# Patient Record
Sex: Male | Born: 1970 | Race: White | Hispanic: No | Marital: Married | Smoking: Current some day smoker
Health system: Southern US, Community
[De-identification: ages and names within clinical notes are randomized; demographics above are authoritative.]

---

## 2020-06-18 ENCOUNTER — Emergency Department: Payer: PRIVATE HEALTH INSURANCE

## 2020-06-18 ENCOUNTER — Emergency Department
Admission: EM | Admit: 2020-06-18 | Discharge: 2020-06-18 | Disposition: A | Discharge status: Home / Self Care | Payer: PRIVATE HEALTH INSURANCE | Attending: Emergency Medicine | Admitting: Emergency Medicine

## 2020-06-18 ENCOUNTER — Encounter: Payer: Self-pay | Admitting: *Deleted

## 2020-06-18 ENCOUNTER — Other Ambulatory Visit: Payer: Self-pay

## 2020-06-18 DIAGNOSIS — X509XXA Other and unspecified overexertion or strenuous movements or postures, initial encounter: Secondary | ICD-10-CM | POA: Diagnosis not present

## 2020-06-18 DIAGNOSIS — S43015A Anterior dislocation of left humerus, initial encounter: Secondary | ICD-10-CM | POA: Diagnosis not present

## 2020-06-18 DIAGNOSIS — S4992XA Unspecified injury of left shoulder and upper arm, initial encounter: Secondary | ICD-10-CM | POA: Diagnosis present

## 2020-06-18 DIAGNOSIS — Y9339 Activity, other involving climbing, rappelling and jumping off: Secondary | ICD-10-CM | POA: Diagnosis not present

## 2020-06-18 DIAGNOSIS — F17200 Nicotine dependence, unspecified, uncomplicated: Secondary | ICD-10-CM | POA: Diagnosis not present

## 2020-06-18 DIAGNOSIS — S42112A Displaced fracture of body of scapula, left shoulder, initial encounter for closed fracture: Secondary | ICD-10-CM | POA: Insufficient documentation

## 2020-06-18 MED ORDER — HYDROCODONE-ACETAMINOPHEN 5-325 MG PO TABS
1.0000 | ORAL_TABLET | Freq: Once | ORAL | Status: AC
  Administered 2020-06-18: 1 via ORAL
  Filled 2020-06-18: qty 1

## 2020-06-18 MED ORDER — MORPHINE SULFATE (PF) 4 MG/ML IV SOLN
4.0000 mg | Freq: Once | INTRAVENOUS | Status: AC
  Administered 2020-06-18: 4 mg via INTRAVENOUS
  Filled 2020-06-18: qty 1

## 2020-06-18 MED ORDER — PROPOFOL 10 MG/ML IV BOLUS
0.5000 mg/kg | Freq: Once | INTRAVENOUS | Status: AC
  Administered 2020-06-18: 44.5 mg via INTRAVENOUS
  Filled 2020-06-18: qty 20

## 2020-06-18 MED ORDER — PROPOFOL 10 MG/ML IV BOLUS
INTRAVENOUS | Status: AC | PRN
  Administered 2020-06-18 (×2): 44.45 mg via INTRAVENOUS

## 2020-06-18 MED ORDER — ONDANSETRON HCL 4 MG/2ML IJ SOLN
4.0000 mg | Freq: Once | INTRAMUSCULAR | Status: AC
  Administered 2020-06-18: 4 mg via INTRAVENOUS
  Filled 2020-06-18: qty 2

## 2020-06-18 NOTE — ED Notes (Signed)
Pt sitting up in bed drinking gingerale.  Tolerating well.

## 2020-06-18 NOTE — ED Notes (Signed)
IV catheter removed intact without complication.  D/C instructions given.  Advised of follow up.  Pt instructed not to drive, pt to be transported home with stable ride.  All  Questions addressed, understanding verbalized.  Pt left ER ambulatory with steady gait.

## 2020-06-18 NOTE — ED Triage Notes (Signed)
Injured left shoulder 2 hours ago.  Patient states he has dislocated shoulder in the past.  Deformity noted.  Patient grunting.  Skin warm and dry.

## 2020-06-18 NOTE — ED Provider Notes (Signed)
[facility] Emergency Department Provider Note   ____________________________________________   Event Date/Time   First MD Initiated Contact with Patient 06/18/20 1554     (approximate)  I have reviewed the triage vital signs and the nursing notes.   HISTORY  Chief Complaint Shoulder Pain    HPI George Kirk is a 50 y.o. 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  Patient states that he has been unable to raise his left arm since he heard this pop and has had significant 10/10, nonradiating, aching pain that is worsened with any movement or palpation.  Patient has not tried any medications for this pain.  Patient currently denies any vision changes, tinnitus, difficulty speaking, facial droop, sore throat, chest pain, shortness of breath, abdominal pain, nausea/vomiting/diarrhea, dysuria, or numbness/paresthesias in any extremity         No past medical history on file.  There are no problems to display for this patient.    Prior to Admission medications   Not on File    Allergies Patient has no known allergies.  No family history on file.  Social History Social History   Tobacco Use  . Smoking status: Current Some Day Smoker  . Smokeless tobacco: Never Used  Substance Use Topics  . Alcohol use: Not Currently  . Drug use: Not Currently    Review of Systems Constitutional: No fever/chills Eyes: No visual changes. ENT: No sore throat. Cardiovascular: Denies chest pain. Respiratory: Denies shortness of breath. Gastrointestinal: No abdominal pain.  No nausea, no vomiting.  No diarrhea. Genitourinary: Negative for dysuria. Musculoskeletal: Positive for acute left shoulder pain Skin: Negative for rash. Neurological: Negative for headaches, weakness/numbness/paresthesias in any extremity Psychiatric: Negative  for suicidal ideation/homicidal ideation   ____________________________________________   PHYSICAL EXAM:  VITAL SIGNS: ED Triage Vitals  Enc Vitals Group     BP 06/18/20 1553 (!) 137/100     Pulse Rate 06/18/20 1553 87     Resp 06/18/20 1553 (!) 24     Temp 06/18/20 1553 97.7 F (36.5 C)     Temp Source 06/18/20 1553 Oral     SpO2 06/18/20 1553 98 %     Weight 06/18/20 1547 196 lb (88.9 kg)     Height 06/18/20 1547 5\' 6"  (1.676 m)     Head Circumference --      Peak Flow --      Pain Score 06/18/20 1547 9     Pain Loc --      Pain Edu? --      Excl. in GC? --    Constitutional: Alert and oriented. Well appearing and in no acute distress. Eyes: Conjunctivae are normal. PERRL. Head: Atraumatic. Nose: No congestion/rhinnorhea. Mouth/Throat: Mucous membranes are moist. Neck: No stridor Cardiovascular: Grossly normal heart sounds.  Good peripheral circulation. Respiratory: Normal respiratory effort.  No retractions. Gastrointestinal: Soft and nontender. No distention. Musculoskeletal: Obvious loss of shoulder contour at the left shoulder with significant tenderness to palpation and inability to range the shoulder secondary to pain Neurologic:  Normal speech and language. No gross focal neurologic deficits are appreciated. Skin:  Skin is warm and dry. No rash noted. Psychiatric: Mood and affect are normal. Speech and behavior are normal.  ____________________________________________   LABS (all labs ordered are listed, but only abnormal results are displayed)  Labs Reviewed - No data to  display ____________________________________________  RADIOLOGY  ED MD interpretation: Three-view x-ray of the left shoulder shows an anterior shoulder dislocation with a possible Hill-Sachs deformity lateral humeral head  Official radiology report(s): DG Shoulder Left  Result Date: 06/18/2020 CLINICAL DATA:  Left shoulder pain, felt a pop. Injury getting out of truck 2 hours ago  EXAM: LEFT SHOULDER - 2+ VIEW COMPARISON:  None. FINDINGS: Anterior dislocation of the humerus with respect to the glenoid. Hill-Sachs impaction injury is suspected to the lateral humeral head. No obvious bony Bankart. Acromioclavicular joint is congruent. IMPRESSION: Anterior shoulder dislocation. Hill-Sachs impaction injury is suspected to the lateral humeral head. Electronically Signed   By: Narda Rutherford M.D.   On: 06/18/2020 16:38    ____________________________________________   PROCEDURES  Procedure(s) performed (including Critical Care):  .Ortho Injury Treatment  Date/Time: 06/18/2020 5:30 PM Performed by: Merwyn Katos, MD Authorized by: Merwyn Katos, MD   Consent:    Consent obtained:  Verbal   Consent given by:  Patient   Risks discussed:  Irreducible dislocation and restricted joint movement   Alternatives discussed:  No treatment, immobilization and referralInjury location: shoulder Location details: left shoulder Injury type: dislocation Dislocation type: anterior Hill-Sachs deformity: yes Chronicity: recurrent Pre-procedure neurovascular assessment: neurovascularly intact Pre-procedure distal perfusion: normal Pre-procedure neurological function: normal Pre-procedure range of motion: reduced  Anesthesia: Local anesthesia used: no  Patient sedated: Yes. Refer to sedation procedure documentation for details of sedation. Manipulation performed: yes Reduction method: external rotation Reduction successful: yes X-ray confirmed reduction: yes Immobilization: sling Splint Applied by: ED Nurse and ED Provider Post-procedure neurovascular assessment: post-procedure neurovascularly intact Post-procedure distal perfusion: normal Post-procedure neurological function: normal Post-procedure range of motion: improved  .Sedation  Date/Time: 06/18/2020 5:32 PM Performed by: Merwyn Katos, MD Authorized by: Merwyn Katos, MD   Consent:    Consent obtained:   Verbal   Consent given by:  Patient   Risks discussed:  Allergic reaction, prolonged hypoxia resulting in organ damage and inadequate sedation   Alternatives discussed:  Analgesia without sedation, anxiolysis and regional anesthesia Universal protocol:    Immediately prior to procedure, a time out was called: yes   Indications:    Procedure performed:  Dislocation reduction   Procedure necessitating sedation performed by:  Physician performing sedation Pre-sedation assessment:    Time since last food or drink:  1230   NPO status caution: urgency dictates proceeding with non-ideal NPO status     ASA classification: class 1 - normal, healthy patient     Mouth opening:  3 or more finger widths   Thyromental distance:  3 finger widths   Mallampati score:  II - soft palate, uvula, fauces visible   Neck mobility: normal     Pre-sedation assessments completed and reviewed: airway patency, cardiovascular function, mental status, pain level, respiratory function and temperature     Pre-sedation assessments completed and reviewed: pre-procedure nausea and vomiting status not reviewed   Immediate pre-procedure details:    Reassessment: Patient reassessed immediately prior to procedure     Reviewed: vital signs     Verified: bag valve mask available, emergency equipment available, intubation equipment available, IV patency confirmed, oxygen available and suction available   Procedure details (see MAR for exact dosages):    Preoxygenation:  Nasal cannula   Sedation:  Propofol   Intended level of sedation: moderate (conscious sedation)   Analgesia:  Morphine   Intra-procedure monitoring:  Blood pressure monitoring, cardiac monitor, continuous capnometry, continuous pulse oximetry, frequent  LOC assessments and frequent vital sign checks   Intra-procedure events: none     Intra-procedure management:  Supplemental oxygen   Total Provider sedation time (minutes):  23 Post-procedure details:     Attendance: Constant attendance by certified staff until patient recovered     Recovery: Patient returned to pre-procedure baseline     Post-sedation assessments completed and reviewed: airway patency, cardiovascular function, mental status, pain level, respiratory function and temperature     Post-sedation assessments completed and reviewed: post-procedure nausea and vomiting status not reviewed     Patient is stable for discharge or admission: yes     Procedure completion:  Tolerated well, no immediate complications .1-3 Lead EKG Interpretation Performed by: Merwyn Katos, MD Authorized by: Merwyn Katos, MD     Interpretation: normal     ECG rate:  89   ECG rate assessment: normal     Rhythm: sinus rhythm     Ectopy: none     Conduction: normal       ____________________________________________   INITIAL IMPRESSION / ASSESSMENT AND PLAN / ED COURSE  As part of my medical decision making, I reviewed the following data within the electronic MEDICAL RECORD NUMBER Nursing notes reviewed and incorporated, Labs reviewed, EKG interpreted, Old chart reviewed, Radiograph reviewed and Notes from prior ED visits reviewed and incorporated     Workup: XR Shoulder Findings: Dislocation  Patient does not currently demonstrate complications of dislocation such as compartment syndrome, arterial injury or nerve injury. The dislocation has been satisfactorily reduced and immobilized, and the patient has been given appropriate analgesia. Rx: sling immobilization 1 week, with gentle ROM to follow Disposition: Discharge with strict return precautions and instructions to follow up with primary MD within 48 hours for further evaluation including referral to an orthopedist.       ____________________________________________   FINAL CLINICAL IMPRESSION(S) / ED DIAGNOSES  Final diagnoses:  Traumatic closed displaced fracture of left shoulder with anterior dislocation, initial encounter      ED Discharge Orders    None       Note:  This document was prepared using Dragon voice recognition software and may include unintentional dictation errors.   Merwyn Katos, MD 06/18/20 1736

## 2020-06-18 NOTE — Sedation Documentation (Signed)
Shoulder sling placed on patient at this time.

## 2020-06-18 NOTE — ED Triage Notes (Signed)
t to triage via wheelchair.  Pt states he was getting into the truck and felt left shoulder pop.  Pt yelling out in triage.  Pt alert.

## 2022-01-02 IMAGING — CR DG SHOULDER 2+V*L*
3 series · 3 of 3 positions shown · non-contrast
Comparison: None.

CLINICAL DATA: Left shoulder pain, felt a pop. Injury getting out
of truck 2 hours ago

EXAM:
LEFT SHOULDER - 2+ VIEW

[shoulder grashey (1 of 2)]
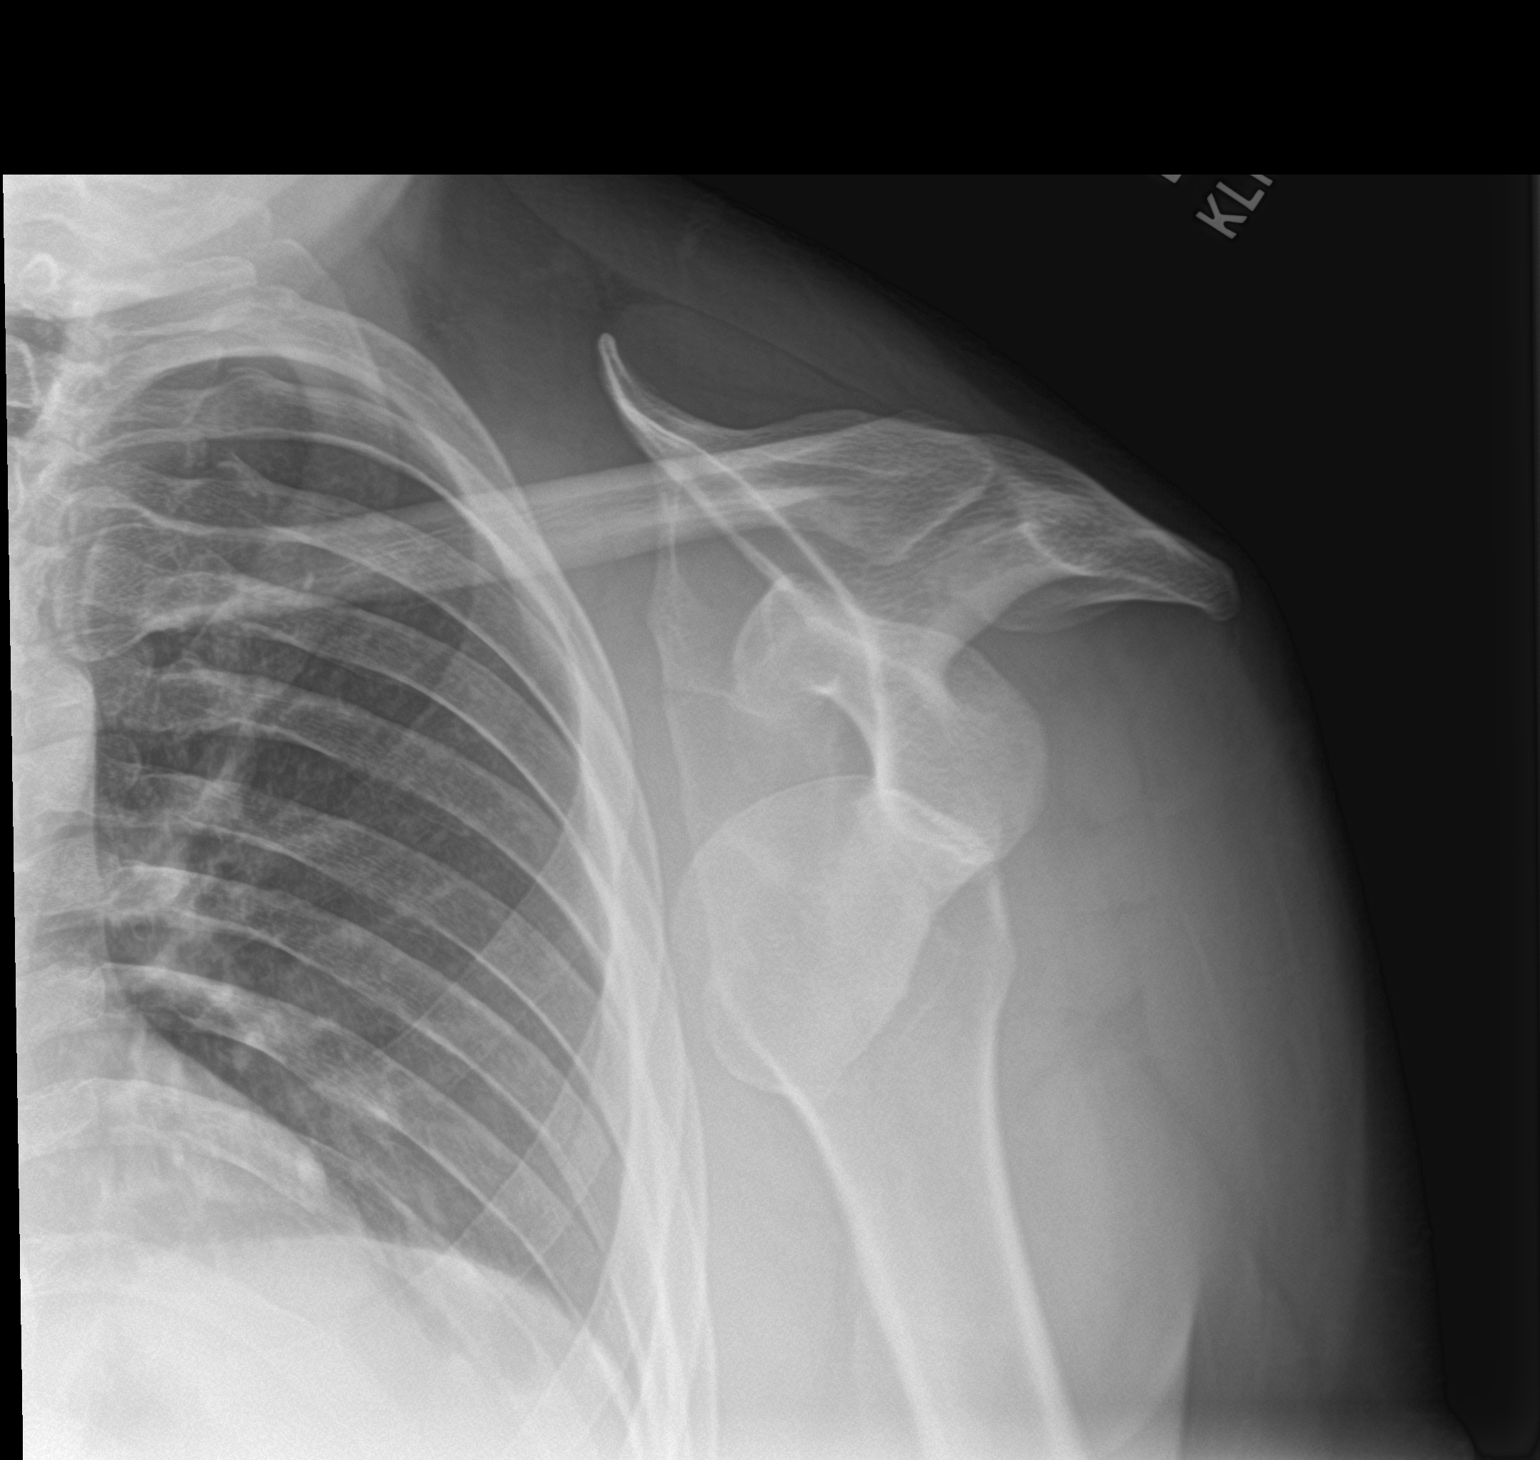

[shoulder y view]
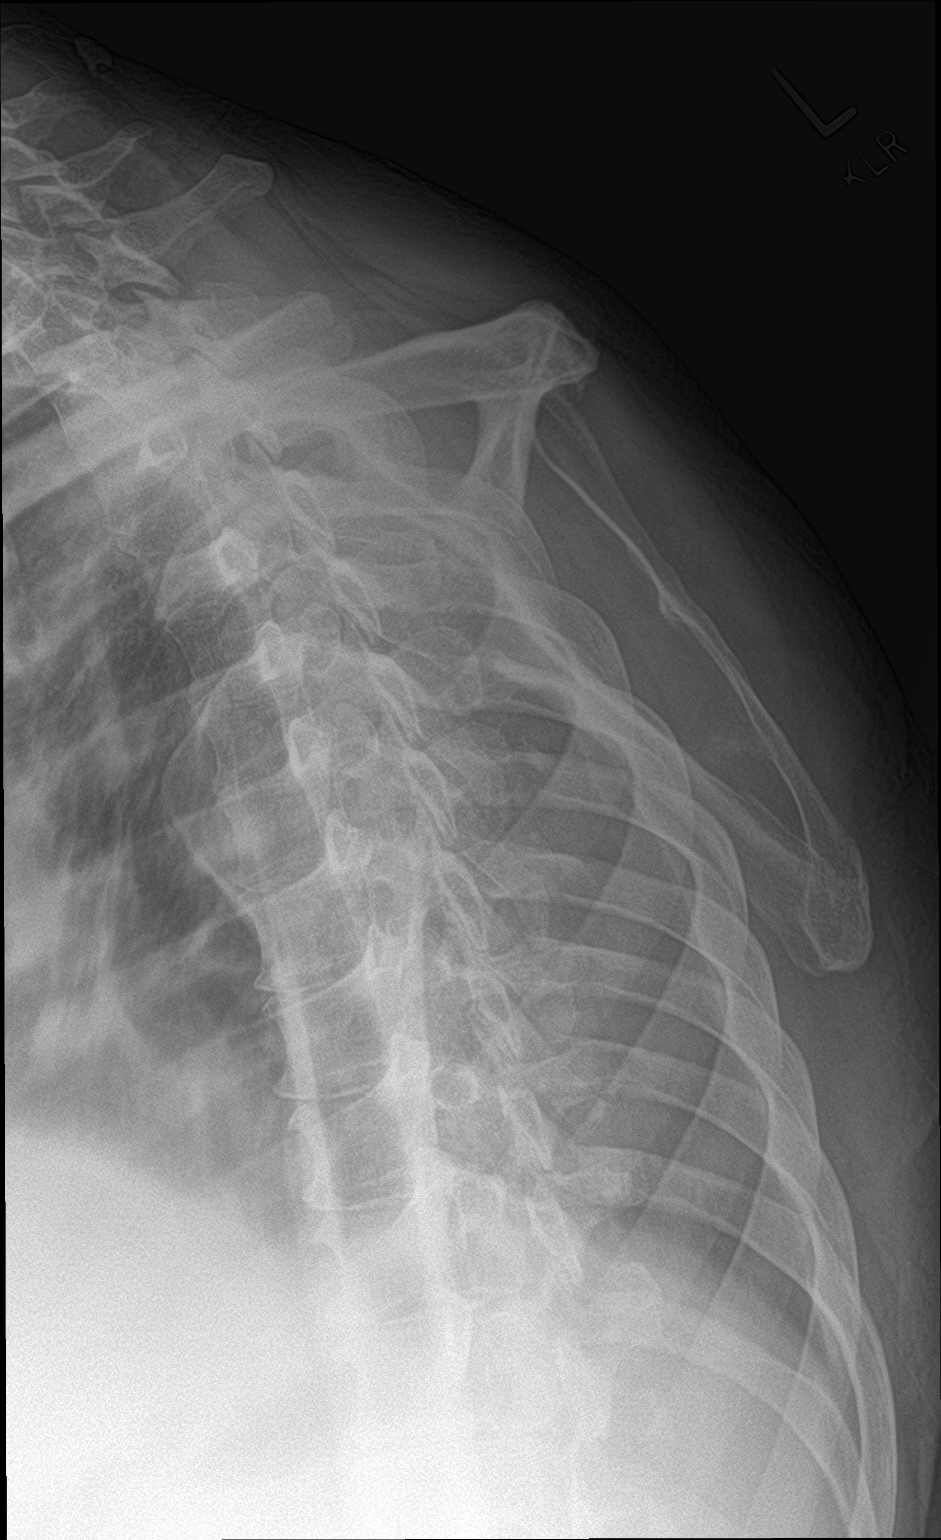

[shoulder grashey (2 of 2)]
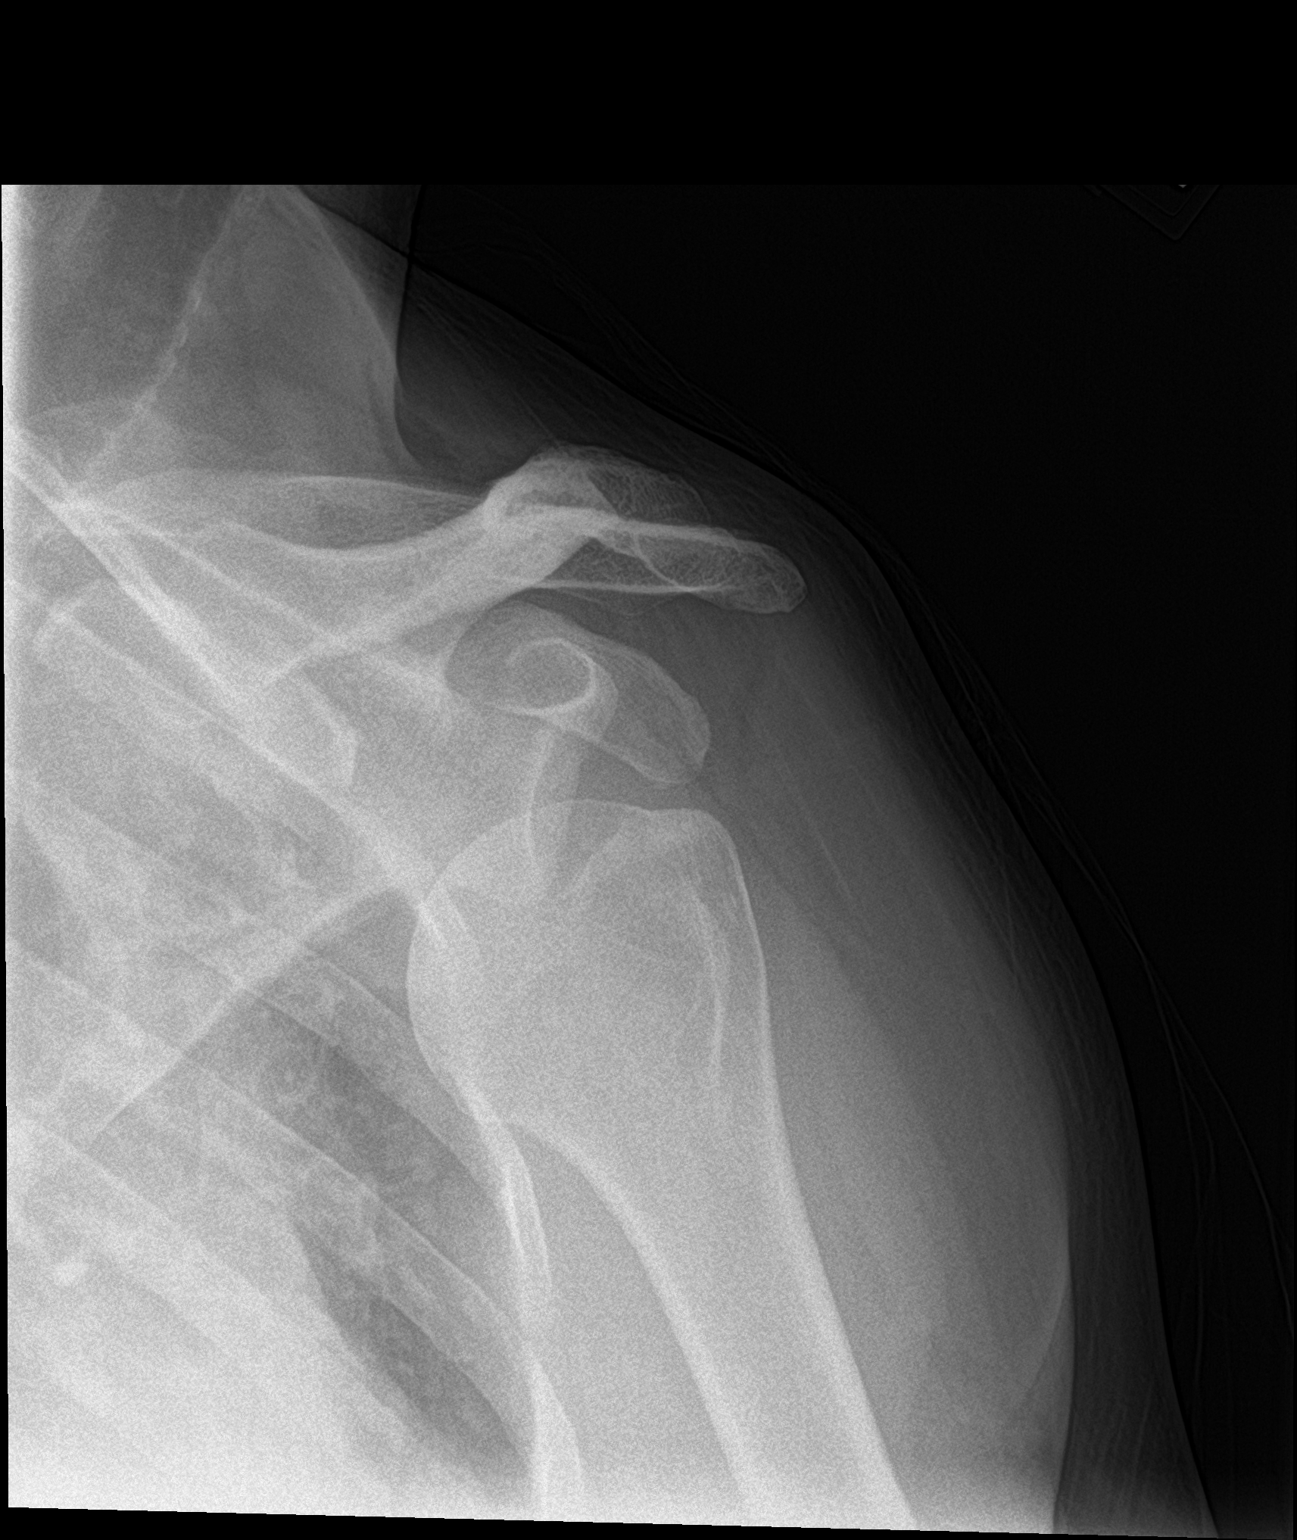

[3 of 3 positions shown; findings below may reference images not displayed]

FINDINGS: Anterior dislocation of the humerus with respect to the glenoid.
Hill-Sachs impaction injury is suspected to the lateral humeral
head. No obvious bony Bankart. Acromioclavicular joint is congruent.
IMPRESSION: Anterior shoulder dislocation. Hill-Sachs impaction injury is
suspected to the lateral humeral head.
